# Patient Record
Sex: Female | Born: 1935 | Race: Black or African American | Hispanic: No | Smoking: Never smoker
Health system: Southern US, Community
[De-identification: ages and names within clinical notes are randomized; demographics above are authoritative.]

## PROBLEM LIST (undated history)

## (undated) DIAGNOSIS — E079 Disorder of thyroid, unspecified: Secondary | ICD-10-CM

## (undated) DIAGNOSIS — R55 Syncope and collapse: Secondary | ICD-10-CM

## (undated) DIAGNOSIS — E113299 Type 2 diabetes mellitus with mild nonproliferative diabetic retinopathy without macular edema, unspecified eye: Secondary | ICD-10-CM

## (undated) DIAGNOSIS — D649 Anemia, unspecified: Secondary | ICD-10-CM

## (undated) DIAGNOSIS — K56609 Unspecified intestinal obstruction, unspecified as to partial versus complete obstruction: Secondary | ICD-10-CM

## (undated) DIAGNOSIS — I10 Essential (primary) hypertension: Secondary | ICD-10-CM

## (undated) DIAGNOSIS — I4891 Unspecified atrial fibrillation: Secondary | ICD-10-CM

## (undated) DIAGNOSIS — R011 Cardiac murmur, unspecified: Secondary | ICD-10-CM

## (undated) DIAGNOSIS — N289 Disorder of kidney and ureter, unspecified: Secondary | ICD-10-CM

## (undated) DIAGNOSIS — E119 Type 2 diabetes mellitus without complications: Secondary | ICD-10-CM

## (undated) DIAGNOSIS — H409 Unspecified glaucoma: Secondary | ICD-10-CM

## (undated) DIAGNOSIS — M65949 Unspecified synovitis and tenosynovitis, unspecified hand: Secondary | ICD-10-CM

## (undated) DIAGNOSIS — M8580 Other specified disorders of bone density and structure, unspecified site: Secondary | ICD-10-CM

## (undated) DIAGNOSIS — G4730 Sleep apnea, unspecified: Secondary | ICD-10-CM

## (undated) DIAGNOSIS — E785 Hyperlipidemia, unspecified: Secondary | ICD-10-CM

## (undated) DIAGNOSIS — M659 Synovitis and tenosynovitis, unspecified: Secondary | ICD-10-CM

## (undated) HISTORY — PX: ABDOMINAL HYSTERECTOMY: SHX81

## (undated) HISTORY — PX: BOWEL RESECTION: SHX1257

## (undated) HISTORY — PX: OTHER SURGICAL HISTORY: SHX169

## (undated) HISTORY — PX: CARDIAC PACEMAKER PLACEMENT: SHX583

---

## 2006-04-18 ENCOUNTER — Ambulatory Visit: Payer: Self-pay | Admitting: Family Medicine

## 2007-01-06 ENCOUNTER — Emergency Department: Payer: Self-pay | Admitting: Emergency Medicine

## 2009-10-08 ENCOUNTER — Ambulatory Visit: Payer: Self-pay | Admitting: Family Medicine

## 2016-08-09 ENCOUNTER — Encounter: Payer: Self-pay | Admitting: Emergency Medicine

## 2016-08-09 ENCOUNTER — Emergency Department: Payer: Medicare HMO

## 2016-08-09 ENCOUNTER — Emergency Department
Admission: EM | Admit: 2016-08-09 | Discharge: 2016-08-09 | Disposition: A | Discharge status: Home / Self Care | Payer: Medicare HMO | Attending: Emergency Medicine | Admitting: Emergency Medicine

## 2016-08-09 DIAGNOSIS — S42202A Unspecified fracture of upper end of left humerus, initial encounter for closed fracture: Secondary | ICD-10-CM | POA: Insufficient documentation

## 2016-08-09 DIAGNOSIS — Y9289 Other specified places as the place of occurrence of the external cause: Secondary | ICD-10-CM | POA: Insufficient documentation

## 2016-08-09 DIAGNOSIS — W01198A Fall on same level from slipping, tripping and stumbling with subsequent striking against other object, initial encounter: Secondary | ICD-10-CM | POA: Diagnosis not present

## 2016-08-09 DIAGNOSIS — I951 Orthostatic hypotension: Secondary | ICD-10-CM | POA: Diagnosis not present

## 2016-08-09 DIAGNOSIS — E119 Type 2 diabetes mellitus without complications: Secondary | ICD-10-CM | POA: Insufficient documentation

## 2016-08-09 DIAGNOSIS — Y999 Unspecified external cause status: Secondary | ICD-10-CM | POA: Diagnosis not present

## 2016-08-09 DIAGNOSIS — S0990XA Unspecified injury of head, initial encounter: Secondary | ICD-10-CM | POA: Insufficient documentation

## 2016-08-09 DIAGNOSIS — Y939 Activity, unspecified: Secondary | ICD-10-CM | POA: Insufficient documentation

## 2016-08-09 DIAGNOSIS — I10 Essential (primary) hypertension: Secondary | ICD-10-CM | POA: Insufficient documentation

## 2016-08-09 DIAGNOSIS — Z79899 Other long term (current) drug therapy: Secondary | ICD-10-CM | POA: Diagnosis not present

## 2016-08-09 DIAGNOSIS — E039 Hypothyroidism, unspecified: Secondary | ICD-10-CM | POA: Insufficient documentation

## 2016-08-09 DIAGNOSIS — S4992XA Unspecified injury of left shoulder and upper arm, initial encounter: Secondary | ICD-10-CM | POA: Diagnosis present

## 2016-08-09 HISTORY — DX: Essential (primary) hypertension: I10

## 2016-08-09 HISTORY — DX: Hyperlipidemia, unspecified: E78.5

## 2016-08-09 HISTORY — DX: Type 2 diabetes mellitus with mild nonproliferative diabetic retinopathy without macular edema, unspecified eye: E11.3299

## 2016-08-09 HISTORY — DX: Synovitis and tenosynovitis, unspecified: M65.9

## 2016-08-09 HISTORY — DX: Syncope and collapse: R55

## 2016-08-09 HISTORY — DX: Disorder of thyroid, unspecified: E07.9

## 2016-08-09 HISTORY — DX: Unspecified atrial fibrillation: I48.91

## 2016-08-09 HISTORY — DX: Sleep apnea, unspecified: G47.30

## 2016-08-09 HISTORY — DX: Type 2 diabetes mellitus without complications: E11.9

## 2016-08-09 HISTORY — DX: Unspecified synovitis and tenosynovitis, unspecified hand: M65.949

## 2016-08-09 HISTORY — DX: Cardiac murmur, unspecified: R01.1

## 2016-08-09 HISTORY — DX: Disorder of kidney and ureter, unspecified: N28.9

## 2016-08-09 HISTORY — DX: Anemia, unspecified: D64.9

## 2016-08-09 HISTORY — DX: Unspecified intestinal obstruction, unspecified as to partial versus complete obstruction: K56.609

## 2016-08-09 HISTORY — DX: Unspecified glaucoma: H40.9

## 2016-08-09 HISTORY — DX: Other specified disorders of bone density and structure, unspecified site: M85.80

## 2016-08-09 LAB — COMPREHENSIVE METABOLIC PANEL
ALBUMIN: 4.1 g/dL (ref 3.5–5.0)
ALT: 12 U/L — AB (ref 14–54)
AST: 19 U/L (ref 15–41)
Alkaline Phosphatase: 103 U/L (ref 38–126)
Anion gap: 6 (ref 5–15)
BUN: 27 mg/dL — AB (ref 6–20)
CHLORIDE: 106 mmol/L (ref 101–111)
CO2: 25 mmol/L (ref 22–32)
CREATININE: 1.84 mg/dL — AB (ref 0.44–1.00)
Calcium: 9.6 mg/dL (ref 8.9–10.3)
GFR calc Af Amer: 28 mL/min — ABNORMAL LOW (ref >60)
GFR, EST NON AFRICAN AMERICAN: 24 mL/min — AB (ref >60)
GLUCOSE: 196 mg/dL — AB (ref 65–99)
POTASSIUM: 4.4 mmol/L (ref 3.5–5.1)
Sodium: 137 mmol/L (ref 135–145)
TOTAL PROTEIN: 7.5 g/dL (ref 6.5–8.1)
Total Bilirubin: 0.6 mg/dL (ref 0.3–1.2)

## 2016-08-09 LAB — CBC WITH DIFFERENTIAL/PLATELET
BASOS ABS: 0 10*3/uL (ref 0–0.1)
BASOS PCT: 1 %
Eosinophils Absolute: 0.1 10*3/uL (ref 0–0.7)
Eosinophils Relative: 1 %
HEMATOCRIT: 31.3 % — AB (ref 35.0–47.0)
Hemoglobin: 10.3 g/dL — ABNORMAL LOW (ref 12.0–16.0)
LYMPHS PCT: 24 %
Lymphs Abs: 1.3 10*3/uL (ref 1.0–3.6)
MCH: 33.8 pg (ref 26.0–34.0)
MCHC: 32.8 g/dL (ref 32.0–36.0)
MCV: 103 fL — AB (ref 80.0–100.0)
MONO ABS: 0.3 10*3/uL (ref 0.2–0.9)
Monocytes Relative: 6 %
NEUTROS ABS: 3.7 10*3/uL (ref 1.4–6.5)
Neutrophils Relative %: 68 %
PLATELETS: 237 10*3/uL (ref 150–440)
RBC: 3.04 MIL/uL — AB (ref 3.80–5.20)
RDW: 14.9 % — AB (ref 11.5–14.5)
WBC: 5.4 10*3/uL (ref 3.6–11.0)

## 2016-08-09 LAB — PROTIME-INR
INR: 0.97
PROTHROMBIN TIME: 12.9 s (ref 11.4–15.2)

## 2016-08-09 LAB — TROPONIN I

## 2016-08-09 MED ORDER — HYDROCODONE-ACETAMINOPHEN 5-325 MG PO TABS: 1.0000 | ORAL_TABLET | Freq: Four times a day (QID) | ORAL | 0 refills | Status: AC | PRN

## 2016-08-09 MED ORDER — MORPHINE SULFATE (PF) 4 MG/ML IV SOLN
4.0000 mg | Freq: Once | INTRAVENOUS | Status: AC
  Administered 2016-08-09: 4 mg via INTRAVENOUS
  Filled 2016-08-09: qty 1

## 2016-08-09 MED ORDER — SODIUM CHLORIDE 0.9 % IV BOLUS (SEPSIS)
1000.0000 mL | Freq: Once | INTRAVENOUS | Status: AC
  Administered 2016-08-09: 1000 mL via INTRAVENOUS

## 2016-08-09 NOTE — ED Notes (Signed)
Adjusted patient's arm back in sling to left arm.  Will continue to monitor.

## 2016-08-09 NOTE — ED Notes (Signed)
Attempted an IV in patient's right hand.

## 2016-08-09 NOTE — ED Provider Notes (Signed)
[facility]-EMERGENCY DEPARTMENT Provider Note   CSN: [id number] Arrival date & time: 08/09/16  4098     History   Chief Complaint Chief Complaint  Patient presents with  . Fall    HPI Olivia Graves is a 83 y.o. female hx of afib on eliquis, DM, HTN, HL, Here presenting with possible syncope, left shoulder pain. Patient states that she had a routine follow-up with her doctor today. Was walking down the hallway in the doctor's office and then before she knew it, she was on the floor. She did not quite remember how she fell. She states that she did hit her head and she had some bleeding from her gums. She has no history of seizures. EMS was called and patient was put on the left shoulder immobilizer and sent to the ER for evaluation. Patient is taking Eliquis for A. fib.     The history is provided by the patient.    Past Medical History:  Diagnosis Date  . Anemia   . Atrial fibrillation (HCC)   . Diabetes mellitus without complication (HCC)   . Glaucoma   . Heart murmur   . Hyperlipidemia   . Hypertension   . hypothyroid   . NPDR (nonproliferative diabetic retinopathy) (HCC)   . Osteopenia   . Pre-syncope   . Renal disorder   . Sleep apnea   . Small bowel obstruction   . Tenosynovitis of finger and hand     There are no active problems to display for this patient.   Past Surgical History:  Procedure Laterality Date  . ABDOMINAL HYSTERECTOMY    . BOWEL RESECTION    . cararact extraction    . CARDIAC PACEMAKER PLACEMENT      OB History    No data available       Home Medications    Prior to Admission medications   Not on File    Family History No family history on file.  Social History Social History  Substance Use Topics  . Smoking status: Never Smoker  . Smokeless tobacco: Never Used  . Alcohol use No     Allergies   Ace inhibitors   Review of Systems Review of Systems  Musculoskeletal:       L shoulder pain   All other systems reviewed and  are negative.    Physical Exam Updated Vital Signs BP 120/79   Pulse 64   Temp 98.6 F (37 C) (Oral)   Resp 16   Ht 5\' 7"  (1.702 m)   Wt 170 lb (77.1 kg)   SpO2 97%   BMI 26.63 kg/m   Physical Exam  Constitutional: She is oriented to person, place, and time.  Chronically ill, slightly uncomfortable   HENT:  Head: Normocephalic.  ? Mild L temporal tenderness, no obvious scalp hematoma. No hemotypanum. There is small abrasion inside of L lower lip. No missing teeth, no jaw tenderness or deformity.   Eyes: EOM are normal. Pupils are equal, round, and reactive to light.  Neck: Normal range of motion. Neck supple.  No obvious midline tenderness   Cardiovascular: Normal rate, regular rhythm and normal heart sounds.   Pulmonary/Chest: Effort normal and breath sounds normal. No respiratory distress. She has no wheezes. She has no rales.  Abdominal: Soft. Bowel sounds are normal. She exhibits no distension. There is no tenderness. There is no guarding.  Musculoskeletal:  Mild tenderness L shoulder and L proximal humerus. No elbow or forearm tenderness. 2+ radial pulses. Able to  hand grasp. No other extremity trauma. Neurovascular intact   Neurological: She is alert and oriented to person, place, and time. No cranial nerve deficit. Coordination normal.  Skin: Skin is warm.  Psychiatric: She has a normal mood and affect.  Nursing note and vitals reviewed.    ED Treatments / Results  Labs (all labs ordered are listed, but only abnormal results are displayed) Labs Reviewed  CBC WITH DIFFERENTIAL/PLATELET  COMPREHENSIVE METABOLIC PANEL  TROPONIN I  PROTIME-INR    EKG  EKG Interpretation None      ED ECG REPORT I, Richardean Canal, the attending physician, personally viewed and interpreted this ECG.   Date: 08/09/2016  EKG Time: 9:39 am,  Rate: 66  Rhythm: atrial fibrillation, rate 66  Axis: normal  Intervals:none  ST&T Change: nonspecific    Radiology Dg Chest 2  View  Result Date: 08/09/2016 CLINICAL DATA:  Right shoulder pain following a fall. EXAM: CHEST  2 VIEW COMPARISON:  None. FINDINGS: Normal sized heart small amount of linear density in the right mid and lower lung zones. Otherwise, clear lungs. Left subclavian dual lead pacemaker leads in satisfactory position. Thoracic spine degenerative changes. Left neck surgical clips. IMPRESSION: Mild linear atelectasis or scarring on the right. Otherwise, unremarkable examination. Electronically Signed   By: Beckie Salts M.D.   On: 08/09/2016 10:30   Ct Head Wo Contrast  Result Date: 08/09/2016 CLINICAL DATA:  Recent trip and fall with facial injury, initial encounter EXAM: CT HEAD WITHOUT CONTRAST CT CERVICAL SPINE WITHOUT CONTRAST TECHNIQUE: Multidetector CT imaging of the head and cervical spine was performed following the standard protocol without intravenous contrast. Multiplanar CT image reconstructions of the cervical spine were also generated. COMPARISON:  None. FINDINGS: CT HEAD FINDINGS Brain: Mild atrophic changes are noted. No findings to suggest acute hemorrhage, acute infarction or space-occupying mass lesion are noted. Changes are seen in the right cerebellar hemisphere consistent with small prior infarcts. Vascular: No hyperdense vessel or unexpected calcification. Skull: Normal. Negative for fracture or focal lesion. Sinuses/Orbits: No acute finding. Other: None. CT CERVICAL SPINE FINDINGS Alignment: Normal Skull base and vertebrae: 7 cervical segments are well visualized. Vertebral body height is well maintained. Multilevel facet hypertrophic changes are noted. No acute fracture or acute facet abnormality is seen. Well corticated density is noted adjacent to left facet at C4 best seen on image number 39 of series 6. This is consistent with prior trauma with nonunion. No acute bony abnormality is seen. The odontoid is within normal limits. Soft tissues and spinal canal: Surrounding soft tissues  demonstrate some mild prominence of the thyroid with macro calcifications within. No definitive nodule is seen. Scattered vascular calcifications and postsurgical changes are noted. Disc levels: No significant disc space narrowing is noted. Mild osteophytic changes are noted in the lower cervical spine particularly anteriorly. Upper chest: Within normal limits. IMPRESSION: CT of the head: Chronic changes without acute abnormality. CT of the cervical spine: Chronic degenerative change without acute abnormality. Electronically Signed   By: Alcide Clever M.D.   On: 08/09/2016 10:31   Ct Cervical Spine Wo Contrast  Result Date: 08/09/2016 CLINICAL DATA:  Recent trip and fall with facial injury, initial encounter EXAM: CT HEAD WITHOUT CONTRAST CT CERVICAL SPINE WITHOUT CONTRAST TECHNIQUE: Multidetector CT imaging of the head and cervical spine was performed following the standard protocol without intravenous contrast. Multiplanar CT image reconstructions of the cervical spine were also generated. COMPARISON:  None. FINDINGS: CT HEAD FINDINGS Brain: Mild atrophic changes are  noted. No findings to suggest acute hemorrhage, acute infarction or space-occupying mass lesion are noted. Changes are seen in the right cerebellar hemisphere consistent with small prior infarcts. Vascular: No hyperdense vessel or unexpected calcification. Skull: Normal. Negative for fracture or focal lesion. Sinuses/Orbits: No acute finding. Other: None. CT CERVICAL SPINE FINDINGS Alignment: Normal Skull base and vertebrae: 7 cervical segments are well visualized. Vertebral body height is well maintained. Multilevel facet hypertrophic changes are noted. No acute fracture or acute facet abnormality is seen. Well corticated density is noted adjacent to left facet at C4 best seen on image number 39 of series 6. This is consistent with prior trauma with nonunion. No acute bony abnormality is seen. The odontoid is within normal limits. Soft tissues and  spinal canal: Surrounding soft tissues demonstrate some mild prominence of the thyroid with macro calcifications within. No definitive nodule is seen. Scattered vascular calcifications and postsurgical changes are noted. Disc levels: No significant disc space narrowing is noted. Mild osteophytic changes are noted in the lower cervical spine particularly anteriorly. Upper chest: Within normal limits. IMPRESSION: CT of the head: Chronic changes without acute abnormality. CT of the cervical spine: Chronic degenerative change without acute abnormality. Electronically Signed   By: Alcide CleverMark  Lukens M.D.   On: 08/09/2016 10:31   Dg Shoulder Left  Result Date: 08/09/2016 CLINICAL DATA:  Left shoulder pain after fall today. EXAM: LEFT SHOULDER - 2+ VIEW COMPARISON:  None. FINDINGS: Mildly displaced and comminuted fracture is seen involving the proximal left humeral head and neck. No dislocation is noted. Mild degenerative changes seen involving the left acromioclavicular joint. IMPRESSION: Mildly displaced and comminuted proximal left humeral head and neck fracture. Electronically Signed   By: Lupita RaiderJames  Green Jr, M.D.   On: 08/09/2016 10:32   Dg Humerus Left  Result Date: 08/09/2016 CLINICAL DATA:  Tripped at doctor's office landing face first, LEFT shoulder pain EXAM: LEFT HUMERUS - 2+ VIEW COMPARISON:  None FINDINGS: Diffuse osseous demineralization. AC joint alignment normal. Comminuted fracture of the proximal LEFT humerus with a nondisplaced fracture plane at the surgical neck and aminimally displaced fracture plane at the greater tuberosity. No dislocation. No additional fractures identified. Visualized LEFT ribs unremarkable. Pacemaker generator projects over upper LEFT chest. IMPRESSION: Comminuted fracture proximal LEFT humerus. Electronically Signed   By: Ulyses SouthwardMark  Boles M.D.   On: 08/09/2016 10:32    Procedures Procedures (including critical care time)  Angiocath insertion Performed by: Richardean Canalavid H Yao  Consent:  Verbal consent obtained. Risks and benefits: risks, benefits and alternatives were discussed Time out: Immediately prior to procedure a "time out" was called to verify the correct patient, procedure, equipment, support staff and site/side marked as required.  Preparation: Patient was prepped and draped in the usual sterile fashion.  Vein Location: R antecube  Ultrasound Guided  Gauge: 20 long   Normal blood return and flush without difficulty Patient tolerance: Patient tolerated the procedure well with no immediate complications.     Medications Ordered in ED Medications  morphine 4 MG/ML injection 4 mg (not administered)     Initial Impression / Assessment and Plan / ED Course  I have reviewed the triage vital signs and the nursing notes.  Pertinent labs & imaging results that were available during my care of the patient were reviewed by me and considered in my medical decision making (see chart for details).     Noralee SpaceLuvenia Denker is a 83 y.o. female here with possible syncope, head injury, L shoulder pain. She has  [employer] pacemaker and will interrogate the pacemaker. Will get CT head, neck, xrays, labs.   10:48 AM Patient is a difficult stick. I placed [place] IV and obtained labs.   2:33 PM Delta trop neg. Initially orthostatic. Given 2 L NS bolus. Cr 1.8, similar to baseline. Felt better now. Pacemaker interrogated and had no events. She may have orthostatic near syncope. Has L proximal humerus fracture and shoulder immobilizer placed. Will have her follow up with ortho and cardiology outpatient.    Final Clinical Impressions(s) / ED Diagnoses   Final diagnoses:  None    New Prescriptions New Prescriptions   No medications on file     Charlynne Pander, MD 08/09/16 1435

## 2016-08-09 NOTE — ED Triage Notes (Signed)
Per [facility], patient comes from PCP office. Patient states she tripped while at the doctors office, landing face first. Patient A&O x4, GCS 15. Patient c/o left shoulder pain and lip pain. Small laceration noted to bottom lip. Bleeding controlled. Patient on elquis.

## 2016-08-09 NOTE — Discharge Instructions (Signed)
Take tylenol for pain.   Take vicodin for severe pain.   Stay hydrated.   Use shoulder immobilizer. See orthopedic doctor   You should call your cardiologist for follow up   Return to ER if you have worse shoulder pain, passing out, headaches, vomiting, fevers, chest pain

## 2017-12-19 ENCOUNTER — Other Ambulatory Visit: Payer: Self-pay | Admitting: Family Medicine

## 2017-12-19 DIAGNOSIS — R221 Localized swelling, mass and lump, neck: Secondary | ICD-10-CM

## 2017-12-26 ENCOUNTER — Ambulatory Visit
Admission: RE | Admit: 2017-12-26 | Discharge: 2017-12-26 | Disposition: A | Discharge status: Home / Self Care | Payer: Medicare HMO | Source: Ambulatory Visit | Attending: Family Medicine | Admitting: Family Medicine

## 2017-12-26 DIAGNOSIS — E042 Nontoxic multinodular goiter: Secondary | ICD-10-CM | POA: Insufficient documentation

## 2017-12-26 DIAGNOSIS — R221 Localized swelling, mass and lump, neck: Secondary | ICD-10-CM

## 2019-06-04 ENCOUNTER — Other Ambulatory Visit: Payer: Self-pay

## 2019-06-04 ENCOUNTER — Emergency Department: Payer: Medicare HMO

## 2019-06-04 ENCOUNTER — Encounter: Payer: Self-pay | Admitting: Emergency Medicine

## 2019-06-04 ENCOUNTER — Emergency Department
Admission: EM | Admit: 2019-06-04 | Discharge: 2019-06-04 | Disposition: A | Discharge status: Home / Self Care | Payer: Medicare HMO | Attending: Emergency Medicine | Admitting: Emergency Medicine

## 2019-06-04 DIAGNOSIS — E039 Hypothyroidism, unspecified: Secondary | ICD-10-CM | POA: Insufficient documentation

## 2019-06-04 DIAGNOSIS — Y999 Unspecified external cause status: Secondary | ICD-10-CM | POA: Insufficient documentation

## 2019-06-04 DIAGNOSIS — Y92481 Parking lot as the place of occurrence of the external cause: Secondary | ICD-10-CM | POA: Diagnosis not present

## 2019-06-04 DIAGNOSIS — Y939 Activity, unspecified: Secondary | ICD-10-CM | POA: Diagnosis not present

## 2019-06-04 DIAGNOSIS — E119 Type 2 diabetes mellitus without complications: Secondary | ICD-10-CM | POA: Diagnosis not present

## 2019-06-04 DIAGNOSIS — Z79899 Other long term (current) drug therapy: Secondary | ICD-10-CM | POA: Insufficient documentation

## 2019-06-04 DIAGNOSIS — M25561 Pain in right knee: Secondary | ICD-10-CM | POA: Insufficient documentation

## 2019-06-04 DIAGNOSIS — Z7982 Long term (current) use of aspirin: Secondary | ICD-10-CM | POA: Diagnosis not present

## 2019-06-04 DIAGNOSIS — I10 Essential (primary) hypertension: Secondary | ICD-10-CM | POA: Diagnosis not present
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
## 2020-03-05 ENCOUNTER — Other Ambulatory Visit: Payer: Self-pay | Admitting: Acute Care

## 2020-03-05 DIAGNOSIS — I639 Cerebral infarction, unspecified: Secondary | ICD-10-CM

## 2020-03-12 ENCOUNTER — Ambulatory Visit
Admission: RE | Admit: 2020-03-12 | Discharge: 2020-03-12 | Disposition: A | Discharge status: Home / Self Care | Payer: Medicare Other | Source: Ambulatory Visit | Attending: Acute Care | Admitting: Acute Care

## 2020-03-12 ENCOUNTER — Other Ambulatory Visit: Payer: Self-pay

## 2020-03-12 ENCOUNTER — Encounter (INDEPENDENT_AMBULATORY_CARE_PROVIDER_SITE_OTHER): Payer: Self-pay

## 2020-03-12 DIAGNOSIS — I639 Cerebral infarction, unspecified: Secondary | ICD-10-CM | POA: Insufficient documentation

## 2022-05-06 ENCOUNTER — Ambulatory Visit
Admission: RE | Admit: 2022-05-06 | Discharge: 2022-05-06 | Disposition: A | Discharge status: Home / Self Care | Payer: Medicare Other | Source: Ambulatory Visit | Attending: Physician Assistant | Admitting: Physician Assistant

## 2022-05-06 ENCOUNTER — Other Ambulatory Visit: Payer: Self-pay | Admitting: Physician Assistant

## 2022-05-06 DIAGNOSIS — I639 Cerebral infarction, unspecified: Secondary | ICD-10-CM | POA: Diagnosis present

## 2022-05-06 IMAGING — CT CT HEAD W/O CM
3 series · 17 of 30 positions shown, 19 images · non-contrast
Comparison: Head CT 08/09/2016

CLINICAL DATA: Cerebrovascular accident. Dizziness for several
weeks. Falls.

EXAM:
CT HEAD WITHOUT CONTRAST
TECHNIQUE: Contiguous axial images were obtained from the base of the skull
through the vertex without intravenous contrast.

[Series 2: head wo · axial · 0.41mm/px · z∈[-96,-1]mm · 5 of 29 slices shown, 7 images (1 of 2)]
[im 5/29  brain]
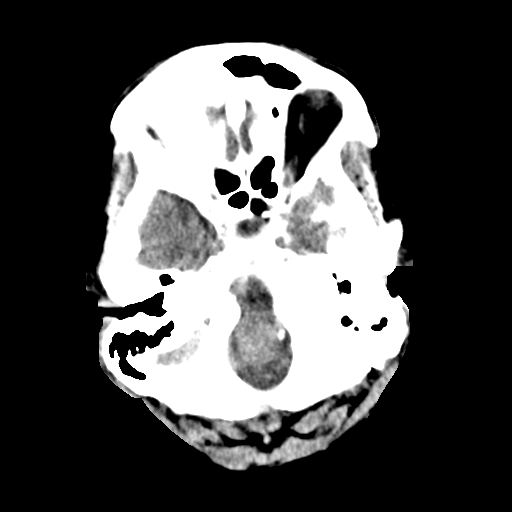
[im 5/29  bone]
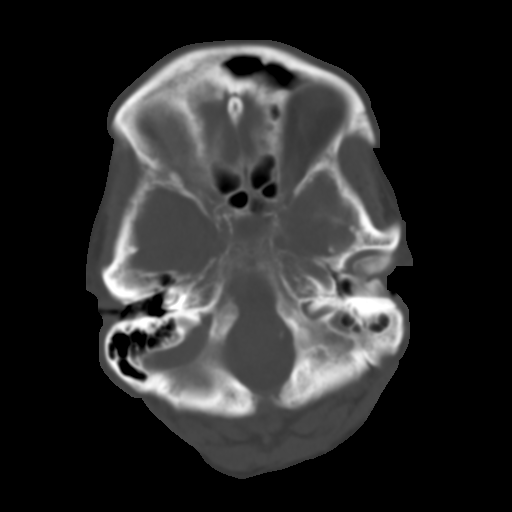
[im 10/29  brain]
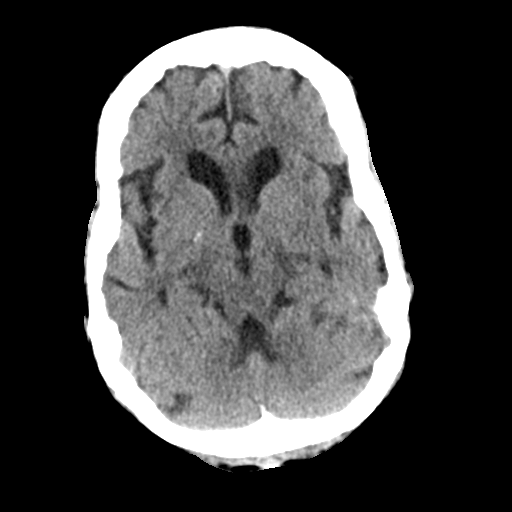
[im 15/29  brain]
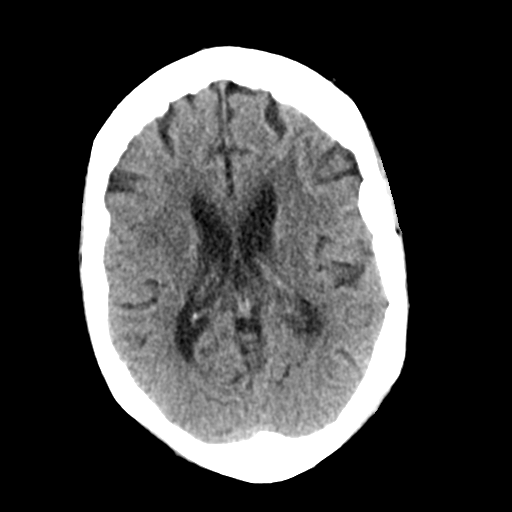
[im 19/29  brain]
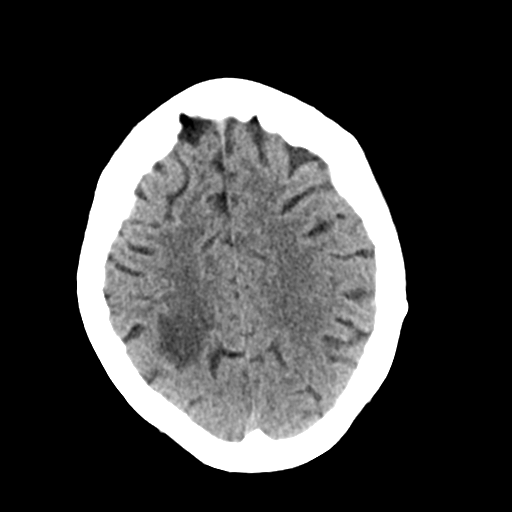
[im 24/29  brain]
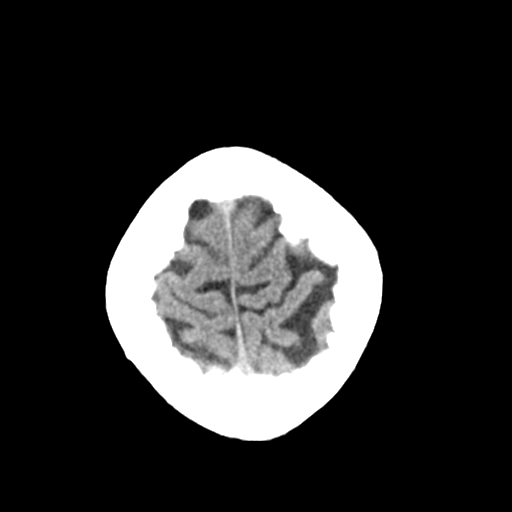
[im 24/29  bone]
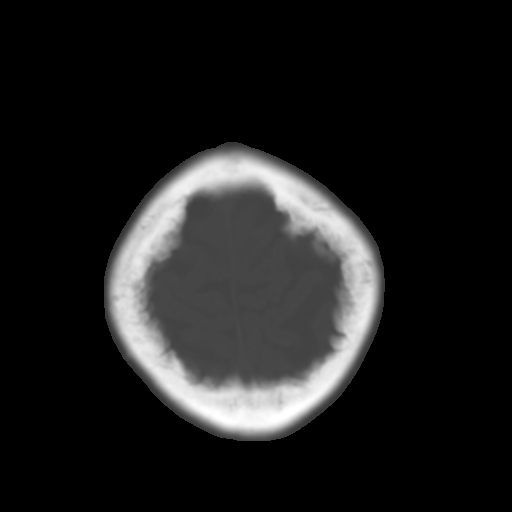

[Series 3: head bone · axial · 0.41mm/px · z∈[-108,+16]mm · 8 of 72 slices shown]
[im 5/72  bone]
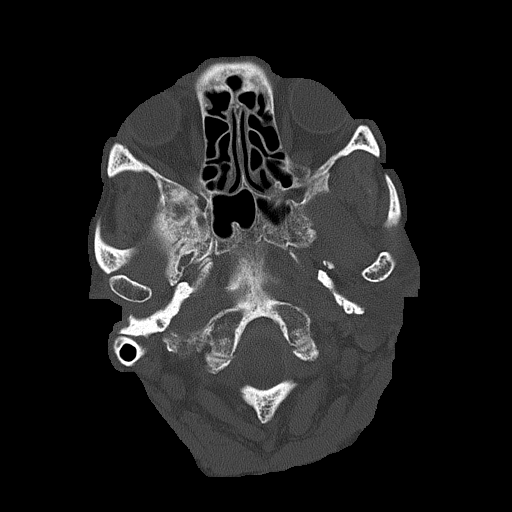
[im 14/72  bone]
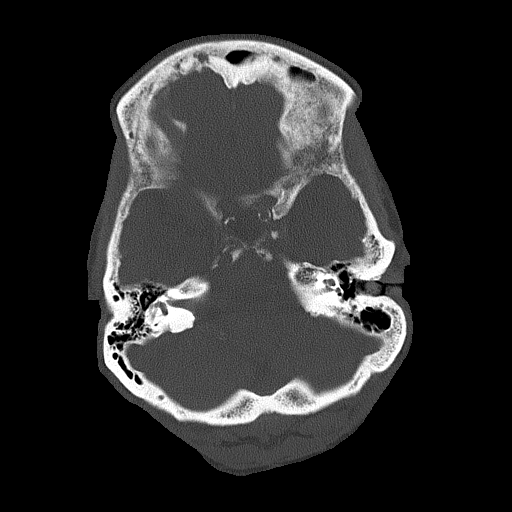
[im 23/72  bone]
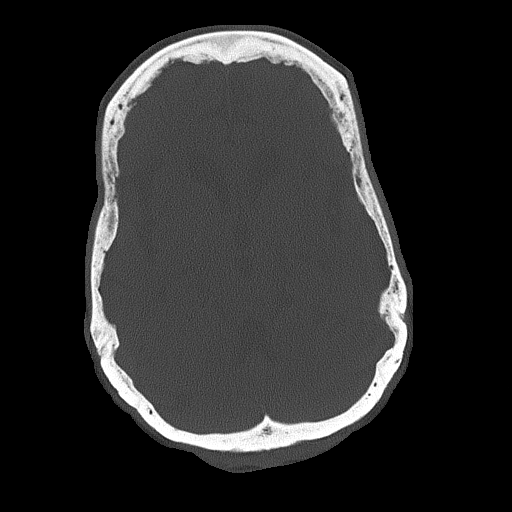
[im 32/72  bone]
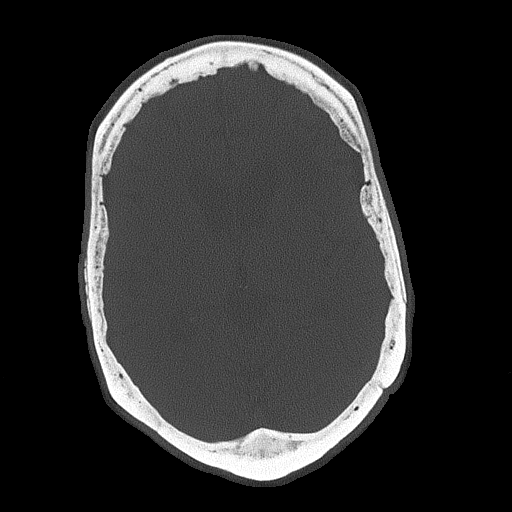
[im 40/72  bone]
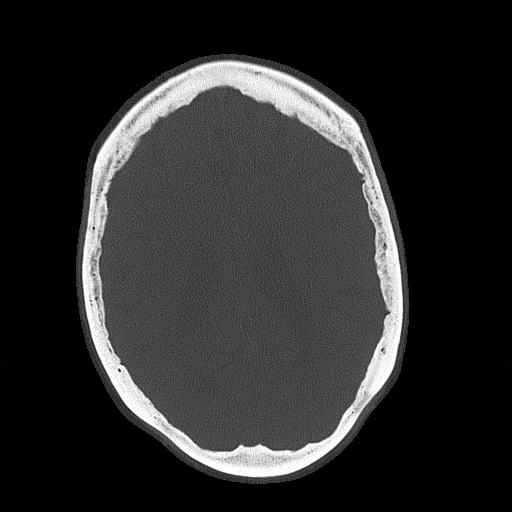
[im 49/72  bone]
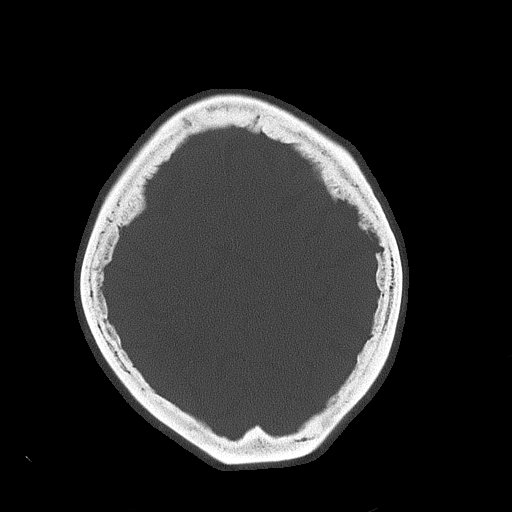
[im 58/72  bone]
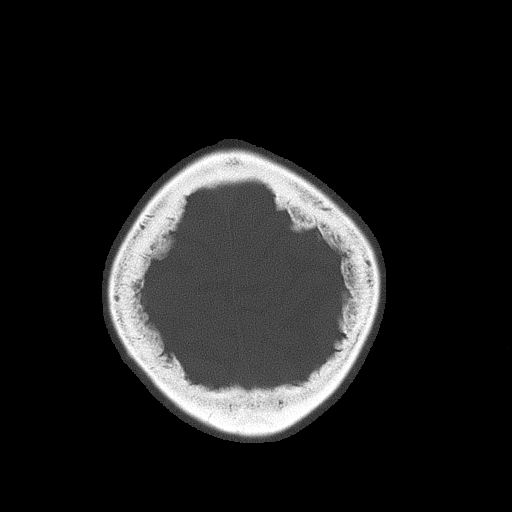
[im 67/72  bone]
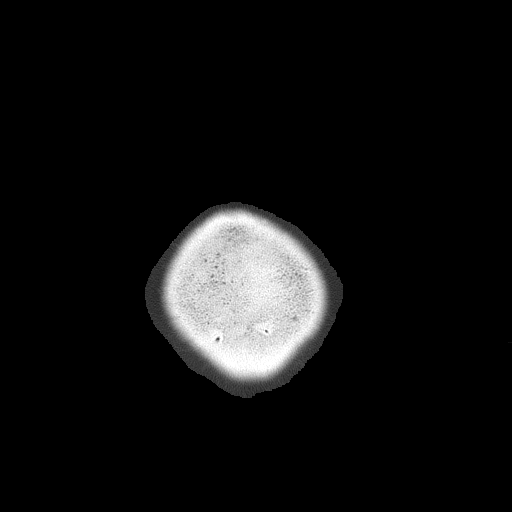

[Series 4: head wo · axial · 0.41mm/px · z∈[-91,-16]mm · 4 of 27 slices shown (2 of 2)]
[im 6/27  brain]
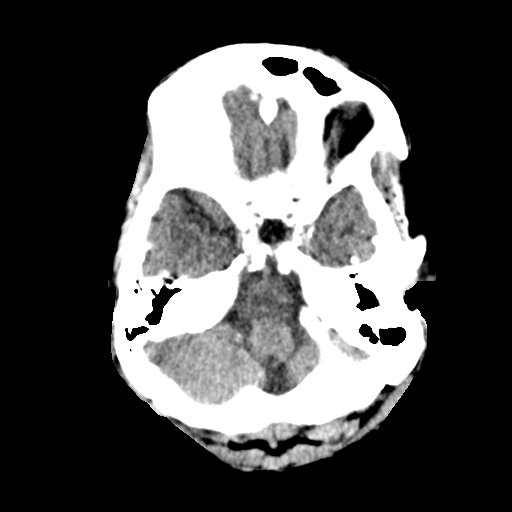
[im 11/27  brain]
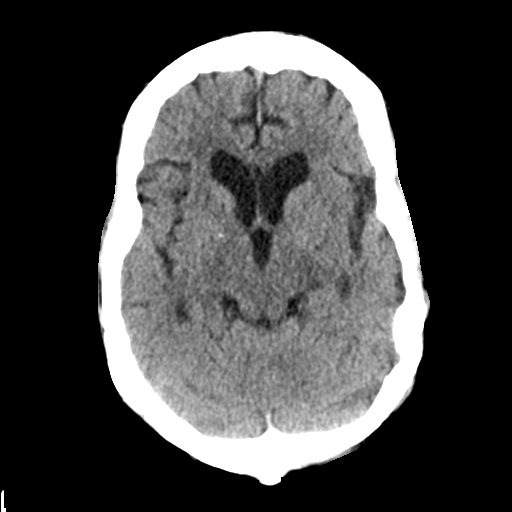
[im 16/27  brain]
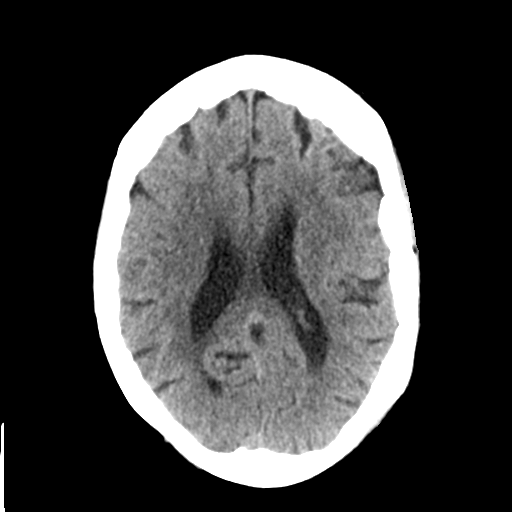
[im 21/27  brain]
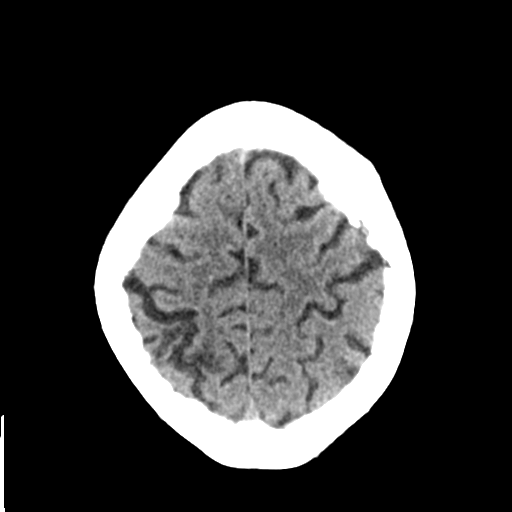

[17 of 30 positions shown; findings below may reference images not displayed]

FINDINGS: Brain: There is no evidence of an acute infarct, intracranial
hemorrhage, mass, midline shift, or extra-axial fluid collection.
Small chronic right parietal and left frontal infarcts are new. A
small chronic right cerebellar infarct is unchanged. The ventricles
are normal in size.

Vascular: Calcified atherosclerosis at the skull base. No hyperdense
vessel.

Skull: No fracture or suspicious osseous lesion. Mild hyperostosis
frontalis interna.

Sinuses/Orbits: Visualized paranasal sinuses and mastoid air cells
are clear. Cerumen in both external auditory canals. Unremarkable
included orbits.

Other: None.
IMPRESSION: 1. No evidence of acute intracranial abnormality.
2. Interval chronic right parietal and left frontal infarcts.
